# Patient Record
Sex: Male | Born: 1960 | Race: White | Hispanic: No | Smoking: Former smoker
Health system: Southern US, Community
[De-identification: ages and names within clinical notes are randomized; demographics above are authoritative.]

## PROBLEM LIST (undated history)

## (undated) DIAGNOSIS — I10 Essential (primary) hypertension: Secondary | ICD-10-CM

## (undated) DIAGNOSIS — K219 Gastro-esophageal reflux disease without esophagitis: Secondary | ICD-10-CM

## (undated) HISTORY — DX: Essential (primary) hypertension: I10

## (undated) HISTORY — PX: SHOULDER ARTHROSCOPY: SHX128

## (undated) HISTORY — PX: ARTHROSCOPY KNEE W/ DRILLING: SUR92

## (undated) HISTORY — DX: Gastro-esophageal reflux disease without esophagitis: K21.9

---

## 2000-05-21 ENCOUNTER — Emergency Department (HOSPITAL_COMMUNITY): Admission: EM | Admit: 2000-05-21 | Discharge: 2000-05-21 | Payer: Self-pay | Admitting: Emergency Medicine

## 2005-04-08 ENCOUNTER — Other Ambulatory Visit: Admission: RE | Admit: 2005-04-08 | Discharge: 2005-04-08 | Payer: Self-pay | Admitting: Urology

## 2010-08-10 ENCOUNTER — Emergency Department (HOSPITAL_COMMUNITY): Admission: EM | Admit: 2010-08-10 | Discharge: 2010-08-10 | Payer: Self-pay | Admitting: Emergency Medicine

## 2010-10-04 ENCOUNTER — Encounter: Payer: Self-pay | Admitting: Rheumatology

## 2010-11-24 LAB — CBC
MCV: 94.7 fL (ref 78.0–100.0)
Platelets: 237 10*3/uL (ref 150–400)
RBC: 4.5 MIL/uL (ref 4.22–5.81)
WBC: 6.6 10*3/uL (ref 4.0–10.5)

## 2011-09-27 ENCOUNTER — Ambulatory Visit: Payer: Self-pay

## 2012-11-06 ENCOUNTER — Other Ambulatory Visit: Payer: Self-pay | Admitting: Internal Medicine

## 2013-02-15 ENCOUNTER — Other Ambulatory Visit (HOSPITAL_COMMUNITY)
Admission: RE | Admit: 2013-02-15 | Discharge: 2013-02-15 | Disposition: A | Discharge status: Home / Self Care | Payer: BC Managed Care – PPO | Source: Ambulatory Visit | Attending: Urology | Admitting: Urology

## 2013-02-15 DIAGNOSIS — N498 Inflammatory disorders of other specified male genital organs: Secondary | ICD-10-CM | POA: Insufficient documentation

## 2013-02-15 DIAGNOSIS — L723 Sebaceous cyst: Secondary | ICD-10-CM | POA: Insufficient documentation

## 2014-12-04 ENCOUNTER — Other Ambulatory Visit: Payer: Self-pay | Admitting: Orthopedic Surgery

## 2014-12-04 DIAGNOSIS — M25512 Pain in left shoulder: Secondary | ICD-10-CM

## 2014-12-18 ENCOUNTER — Other Ambulatory Visit: Payer: Self-pay

## 2015-11-06 ENCOUNTER — Ambulatory Visit
Admission: RE | Admit: 2015-11-06 | Discharge: 2015-11-06 | Disposition: A | Discharge status: Home / Self Care | Payer: BLUE CROSS/BLUE SHIELD | Source: Ambulatory Visit | Attending: Internal Medicine | Admitting: Internal Medicine

## 2015-11-06 ENCOUNTER — Other Ambulatory Visit: Payer: Self-pay | Admitting: Internal Medicine

## 2015-11-06 DIAGNOSIS — M549 Dorsalgia, unspecified: Secondary | ICD-10-CM

## 2015-11-14 ENCOUNTER — Encounter: Payer: Self-pay | Admitting: Family Medicine

## 2015-11-14 DIAGNOSIS — I10 Essential (primary) hypertension: Secondary | ICD-10-CM | POA: Insufficient documentation

## 2015-12-03 ENCOUNTER — Encounter: Payer: Self-pay | Admitting: Family Medicine

## 2015-12-03 ENCOUNTER — Ambulatory Visit (INDEPENDENT_AMBULATORY_CARE_PROVIDER_SITE_OTHER): Payer: BLUE CROSS/BLUE SHIELD | Admitting: Family Medicine

## 2015-12-03 VITALS — BP 140/88 | HR 72 | Temp 98.5°F | Resp 16 | Ht 71.0 in | Wt 212.0 lb

## 2015-12-03 DIAGNOSIS — M159 Polyosteoarthritis, unspecified: Secondary | ICD-10-CM

## 2015-12-03 DIAGNOSIS — M1990 Unspecified osteoarthritis, unspecified site: Secondary | ICD-10-CM | POA: Insufficient documentation

## 2015-12-03 DIAGNOSIS — N200 Calculus of kidney: Secondary | ICD-10-CM | POA: Diagnosis not present

## 2015-12-03 DIAGNOSIS — K219 Gastro-esophageal reflux disease without esophagitis: Secondary | ICD-10-CM | POA: Insufficient documentation

## 2015-12-03 DIAGNOSIS — M150 Primary generalized (osteo)arthritis: Secondary | ICD-10-CM | POA: Diagnosis not present

## 2015-12-03 DIAGNOSIS — I10 Essential (primary) hypertension: Secondary | ICD-10-CM | POA: Diagnosis not present

## 2015-12-03 DIAGNOSIS — N529 Male erectile dysfunction, unspecified: Secondary | ICD-10-CM | POA: Diagnosis not present

## 2015-12-03 DIAGNOSIS — M8949 Other hypertrophic osteoarthropathy, multiple sites: Secondary | ICD-10-CM

## 2015-12-03 NOTE — Progress Notes (Signed)
Patient ID: Michael FreestoneJoe Wheeler, male   DOB: August 15, 1961, 55 y.o.   MRN: [id number]    Subjective:    Patient ID: Michael FreestoneJoe Pellot, male    DOB: August 15, 1961, 55 y.o.   MRN: [id number]  [facility] here to establish care. Of note he was last been seen by Dr. Nehemiah SettlePolite. At the end of the visit he states that he had a scheduled physical scheduled with him for next month I recommended that he chews one provider to be his primary care physician so he can have the best care as possible and to decrease medical errors.  He is history of hypertension he is intentionally lost about 30 pounds trying to get off of his blood pressure medication. He has not taken his propanolol on a regular basis but does take losartan HCTZ. He does not have a history of hyperlipidemia no coronary artery disease.  For the past few weeks he's been suffering with a pulled muscle in his left upper abdomen and flank region. He had x-ray CT scan which showed kidney stones but this was not an active problem. He has had stones in the past as well. He was initially given muscle relaxers and then he was prescribed hydrocodone which she is still taking twice a day. 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 He also had a no prescription for prednisone which he has been tapering he started at 60 mg he states this has helped. He's also tried heat and ice. He does lift weights and exercise but he does not recall any injury to the area.  His history of acid reflux but he does not take the Prilosec on a regular basis he notices specific foods that trigger this. He has history of osteoarthritis in his knees and his back is had arthroscopic done in the past he takes Celebrex typically.  He is in a relationship he has 2 adult children  He is overdue for tetanus booster he is overdue for colonoscopy  He follows with urology to get his  prescription for Viagra he states that he buys it from [place]  Review Of Systems:  GEN- denies fatigue, fever, weight loss,weakness, recent illness HEENT- denies eye drainage, change in vision, nasal discharge, CVS- denies chest pain, palpitations RESP- denies SOB, cough, wheeze ABD- denies N/V, change in stools, abd pain GU- denies dysuria, hematuria, dribbling, incontinence MSK-+joint pain, muscle aches, injury Neuro- denies headache, dizziness, syncope, seizure activity       Objective:    BP 140/88 mmHg  Pulse 72  Temp(Src) 98.5 F (36.9 C) (Oral)  Resp 16  Ht 5\' 11"  (1.803 m)  Wt 212 lb (96.163 kg)  BMI 29.58 kg/m2 GEN- NAD, alert and oriented x3 HEENT- PERRL, EOMI, non injected sclera, pink conjunctiva, MMM, oropharynx clear Neck- Supple, no thyromegaly CVS- RRR, no murmur RESP-CTAB ABD-NABS,soft,mild TTP LUQ beneath rib cage, no hernia noted, no spasm  MSK- spine NT, good ROM Psych- normal affect and mood EXT- No edema Pulses- Radial, DP- 2+        Assessment & Plan:      Problem List Items Addressed This Visit    OA (osteoarthritis)    He has Celebrex that he takes on a regular basis. Of note he is also currently self treating for this pulled muscle in his abdomen. He is tapering down her dose of prednisone that he  has at home. I recommend that he not take this along with Celebrex. He is also on hydrocodone given by his previous primary care provider. I will obtain records and imaging with regards to this recent injury and his past      Kidney stones   Hypertension - Primary    His blood pressure today is little elevated. Reiterated that if his blood pressure has been running up that he should take the Inderal a regular basis. For now he states that he does want to continue with the losartan HCTZ as it is typically well controlled. He will also make a decision he will follow-up with my office for continued care I recommended that he not have 2 different  primary care providers. If he does he needs a physical scheduled for fasting labs.      Relevant Medications   propranolol ER (INDERAL LA) 80 MG 24 hr capsule   GERD (gastroesophageal reflux disease)    Prilosec as needed      Erectile dysfunction    He will continue Viagra prescribed by his urologist         Note: This dictation was prepared with Dragon dictation along with smaller phrase technology. Any transcriptional errors that result from this process are unintentional.

## 2015-12-03 NOTE — Patient Instructions (Addendum)
Continue current medications Release of records- Dr. Nehemiah SettlePolite  F/U physical end of April- Fasting

## 2015-12-03 NOTE — Assessment & Plan Note (Signed)
He will continue Viagra prescribed by his urologist

## 2015-12-03 NOTE — Assessment & Plan Note (Signed)
He has Celebrex that he takes on a regular basis. Of note he is also currently self treating for this pulled muscle in his abdomen. He is tapering down her dose of prednisone that he has at home. I recommend that he not take this along with Celebrex. He is also on hydrocodone given by his previous primary care provider. I will obtain records and imaging with regards to this recent injury and his past

## 2015-12-03 NOTE — Assessment & Plan Note (Signed)
His blood pressure today is little elevated. Reiterated that if his blood pressure has been running up that he should take the Inderal a regular basis. For now he states that he does want to continue with the losartan HCTZ as it is typically well controlled. He will also make a decision he will follow-up with my office for continued care I recommended that he not have 2 different primary care providers. If he does he needs a physical scheduled for fasting labs.

## 2015-12-03 NOTE — Assessment & Plan Note (Signed)
Prilosec as needed.

## 2016-02-02 ENCOUNTER — Encounter: Payer: BLUE CROSS/BLUE SHIELD | Admitting: Family Medicine

## 2016-02-04 ENCOUNTER — Encounter: Payer: Self-pay | Admitting: Family Medicine

## 2016-02-06 ENCOUNTER — Ambulatory Visit: Payer: BLUE CROSS/BLUE SHIELD | Admitting: Family Medicine

## 2016-02-20 ENCOUNTER — Encounter: Payer: Self-pay | Admitting: Family Medicine

## 2016-02-20 ENCOUNTER — Ambulatory Visit (INDEPENDENT_AMBULATORY_CARE_PROVIDER_SITE_OTHER): Payer: BLUE CROSS/BLUE SHIELD | Admitting: Family Medicine

## 2016-02-20 VITALS — BP 130/64 | HR 62 | Temp 98.2°F | Resp 14 | Ht 71.0 in | Wt 208.0 lb

## 2016-02-20 DIAGNOSIS — I10 Essential (primary) hypertension: Secondary | ICD-10-CM | POA: Diagnosis not present

## 2016-02-20 DIAGNOSIS — Z1159 Encounter for screening for other viral diseases: Secondary | ICD-10-CM

## 2016-02-20 DIAGNOSIS — Z114 Encounter for screening for human immunodeficiency virus [HIV]: Secondary | ICD-10-CM

## 2016-02-20 DIAGNOSIS — N529 Male erectile dysfunction, unspecified: Secondary | ICD-10-CM

## 2016-02-20 DIAGNOSIS — Z0000 Encounter for general adult medical examination without abnormal findings: Secondary | ICD-10-CM | POA: Diagnosis not present

## 2016-02-20 LAB — CBC WITH DIFFERENTIAL/PLATELET
BASOS PCT: 1 %
Basophils Absolute: 50 cells/uL (ref 0–200)
EOS ABS: 50 {cells}/uL (ref 15–500)
Eosinophils Relative: 1 %
HCT: 43 % (ref 38.5–50.0)
HEMOGLOBIN: 14.3 g/dL (ref 13.0–17.0)
LYMPHS ABS: 1650 {cells}/uL (ref 850–3900)
Lymphocytes Relative: 33 %
MCH: 32.4 pg (ref 27.0–33.0)
MCHC: 33.3 g/dL (ref 32.0–36.0)
MCV: 97.5 fL (ref 80.0–100.0)
MONO ABS: 550 {cells}/uL (ref 200–950)
MONOS PCT: 11 %
MPV: 9.5 fL (ref 7.5–12.5)
NEUTROS ABS: 2700 {cells}/uL (ref 1500–7800)
Neutrophils Relative %: 54 %
PLATELETS: 278 10*3/uL (ref 140–400)
RBC: 4.41 MIL/uL (ref 4.20–5.80)
RDW: 12.9 % (ref 11.0–15.0)
WBC: 5 10*3/uL (ref 3.8–10.8)

## 2016-02-20 LAB — COMPREHENSIVE METABOLIC PANEL
ALK PHOS: 39 U/L — AB (ref 40–115)
ALT: 16 U/L (ref 9–46)
AST: 25 U/L (ref 10–35)
Albumin: 4.2 g/dL (ref 3.6–5.1)
BUN: 18 mg/dL (ref 7–25)
CO2: 24 mmol/L (ref 20–31)
CREATININE: 0.98 mg/dL (ref 0.70–1.33)
Calcium: 9.3 mg/dL (ref 8.6–10.3)
Chloride: 104 mmol/L (ref 98–110)
Glucose, Bld: 103 mg/dL — ABNORMAL HIGH (ref 70–99)
POTASSIUM: 4.2 mmol/L (ref 3.5–5.3)
Sodium: 138 mmol/L (ref 135–146)
TOTAL PROTEIN: 6.9 g/dL (ref 6.1–8.1)
Total Bilirubin: 0.5 mg/dL (ref 0.2–1.2)

## 2016-02-20 LAB — LIPID PANEL
CHOL/HDL RATIO: 2 ratio (ref <=5.0)
CHOLESTEROL: 134 mg/dL (ref 125–200)
HDL: 68 mg/dL (ref >=40)
LDL Cholesterol: 51 mg/dL (ref <130)
Triglycerides: 74 mg/dL (ref <150)
VLDL: 15 mg/dL (ref <30)

## 2016-02-20 MED ORDER — SILDENAFIL CITRATE 100 MG PO TABS: 100.0000 mg | ORAL_TABLET | ORAL | Status: DC | PRN

## 2016-02-20 NOTE — Patient Instructions (Addendum)
I recommend eye visit once a year I recommend dental visit every 6 months Goal is to  Exercise 30 minutes 5 days a week We will send a letter with lab results  Release of records- [facility]  Release of records- [facility] for colonoscopy  F/U as needed

## 2016-02-20 NOTE — Assessment & Plan Note (Signed)
I will refill his viagra when needed

## 2016-02-20 NOTE — Progress Notes (Signed)
Patient ID: Michael Wheeler, male   DOB: May 10, 1961, 55 y.o.   MRN: [id number]    Subjective:    Patient ID: Michael FreestoneJoe Wheeler, male    DOB: May 10, 1961, 55 y.o.   MRN: [id number]  Patient presents for CPE   Hypertension- taking meds as prescribed's. he is not having any concerns today.  Colonoscopy- before meals [facility] Dr[facility] Laural BenesJohnson about 5 years ago he had 1 polyp he states he does not want a repeat his colonoscopy until 10 years a recommendation was that he come back in 5 years.  Follows with urology- for erectile dysfunction on Viagra, he states that he had testosterone and PSA testing done and was told it was normal. He does request that I refill his Viagra which she actually gets from a mail order in  [place]   Immunizations- TDAP done with recent dog bite     Agrees to HIV testing/Hep C testing   Review Of Systems:  GEN- denies fatigue, fever, weight loss,weakness, recent illness HEENT- denies eye drainage, change in vision, nasal discharge, CVS- denies chest pain, palpitations RESP- denies SOB, cough, wheeze ABD- denies N/V, change in stools, abd pain GU- denies dysuria, hematuria, dribbling, incontinence MSK- + joint pain, muscle aches, injury Neuro- denies headache, dizziness, syncope, seizure activity       Objective:    BP 130/64 mmHg  Pulse 62  Temp(Src) 98.2 F (36.8 C) (Oral)  Resp 14  Ht 5\' 11"  (1.803 m)  Wt 208 lb (94.348 kg)  BMI 29.02 kg/m2 GEN- NAD, alert and oriented x3 HEENT- PERRL, EOMI, non injected sclera, pink conjunctiva, MMM, oropharynx clear, nares clear rhinorrhea  Neck- Supple, no thyromegaly CVS- RRR, no murmur RESP-CTAB ABD-NABS,soft,NT,ND GU- deferred  EXT- No edema Pulses- Radial, DP- 2+        Assessment & Plan:      Problem List Items Addressed This Visit    Hypertension    Well controlled, no change       Relevant Medications   sildenafil (VIAGRA) 100 MG tablet   Erectile dysfunction    I will refill  his viagra when needed       Other Visit Diagnoses    Routine general medical examination at a health care facility    -  Primary    CPE done, obtain records from Urology and GI to update records, fasting labs today, Encourage EXCERISE, healthy eating     Relevant Orders    CBC with Differential/Platelet    Comprehensive metabolic panel    Lipid panel    Hepatitis C Antibody    Need for hepatitis C screening test        Encounter for screening for HIV        Relevant Orders    HIV antibody       Note: This dictation was prepared with Dragon dictation along with smaller phrase technology. Any transcriptional errors that result from this process are unintentional.

## 2016-02-20 NOTE — Assessment & Plan Note (Signed)
Well-controlled, no change 

## 2016-02-21 LAB — HIV ANTIBODY (ROUTINE TESTING W REFLEX): HIV: NONREACTIVE

## 2016-02-21 LAB — HEPATITIS C ANTIBODY: HCV Ab: NEGATIVE

## 2016-04-22 ENCOUNTER — Telehealth: Payer: Self-pay | Admitting: *Deleted

## 2016-04-22 ENCOUNTER — Other Ambulatory Visit: Payer: Self-pay | Admitting: Family Medicine

## 2016-04-22 NOTE — Telephone Encounter (Signed)
Patient called and needs a refill of his Viagra . Patient pharmacy is [facility] on [address]. Please advise. Thanks.

## 2016-04-22 NOTE — Telephone Encounter (Signed)
Please see patient message.

## 2016-04-22 NOTE — Telephone Encounter (Signed)
Ok to refill??  Last office visit /refill 02/20/2016.

## 2016-04-23 NOTE — Telephone Encounter (Signed)
Please verify that patient is actually taking this often. This would be 1 viagra tablet every day, since it has only been 2 months?

## 2016-04-26 ENCOUNTER — Other Ambulatory Visit: Payer: Self-pay | Admitting: *Deleted

## 2016-04-26 MED ORDER — SILDENAFIL CITRATE 20 MG PO TABS: ORAL_TABLET | ORAL | 0 refills | Status: AC

## 2016-04-26 MED ORDER — SILDENAFIL CITRATE 100 MG PO TABS: 100.0000 mg | ORAL_TABLET | ORAL | 0 refills | Status: DC | PRN

## 2016-04-26 NOTE — Telephone Encounter (Signed)
Received return call from patient.   Reports that he only buys partial fill of medication (7 pills/ month).   Prescription sent to pharmacy.

## 2016-04-26 NOTE — Telephone Encounter (Signed)
Call placed to patient. LMTRC.  

## 2016-06-15 ENCOUNTER — Telehealth: Payer: Self-pay | Admitting: Family Medicine

## 2016-06-15 DIAGNOSIS — M5136 Other intervertebral disc degeneration, lumbar region: Secondary | ICD-10-CM

## 2016-06-15 DIAGNOSIS — M5134 Other intervertebral disc degeneration, thoracic region: Secondary | ICD-10-CM

## 2016-06-15 NOTE — Telephone Encounter (Signed)
Patient said that when he was in for his cpe with dr Jeanice Limdurham, he mentioned his back pain with her, he would like a referral to [facility] for this if possible  [phone] (H)

## 2016-06-15 NOTE — Telephone Encounter (Signed)
Ok to place referral.

## 2016-06-15 NOTE — Telephone Encounter (Signed)
He has DDD lumbar and thoracic spine No MRI on board, advise sometimes they will not schedule without further imaging But go ahead and place referral first.

## 2016-06-16 NOTE — Telephone Encounter (Signed)
Call placed to patient and patient made aware.   Referral orders placed.  

## 2016-11-04 ENCOUNTER — Telehealth: Payer: Self-pay | Admitting: *Deleted

## 2016-11-04 DIAGNOSIS — M5136 Other intervertebral disc degeneration, lumbar region: Secondary | ICD-10-CM

## 2016-11-04 DIAGNOSIS — M5134 Other intervertebral disc degeneration, thoracic region: Secondary | ICD-10-CM

## 2016-11-04 NOTE — Telephone Encounter (Signed)
Received call from patient.   Reports that he would like to schedule with [facility] at this time for his back pain.   Referral was placed in Oct 2017.

## 2016-11-05 NOTE — Telephone Encounter (Signed)
Pt now ready to be seen by Neuro-Surg.  New order placed.

## 2016-11-19 ENCOUNTER — Other Ambulatory Visit: Payer: Self-pay | Admitting: Family Medicine

## 2017-01-26 ENCOUNTER — Telehealth: Payer: Self-pay

## 2017-01-27 NOTE — Telephone Encounter (Signed)
error 

## 2017-03-01 ENCOUNTER — Ambulatory Visit (INDEPENDENT_AMBULATORY_CARE_PROVIDER_SITE_OTHER): Payer: BLUE CROSS/BLUE SHIELD | Admitting: Family Medicine

## 2017-03-01 ENCOUNTER — Encounter: Payer: Self-pay | Admitting: Family Medicine

## 2017-03-01 VITALS — BP 132/90 | HR 78 | Temp 98.1°F | Resp 14 | Ht 70.0 in | Wt 239.0 lb

## 2017-03-01 DIAGNOSIS — Z0000 Encounter for general adult medical examination without abnormal findings: Secondary | ICD-10-CM | POA: Diagnosis not present

## 2017-03-01 DIAGNOSIS — I10 Essential (primary) hypertension: Secondary | ICD-10-CM

## 2017-03-01 NOTE — Progress Notes (Signed)
Subjective:    Patient ID: Michael Wheeler, male    DOB: 20-Jul-1961, 56 y.o.   MRN: [id number]  HPI  Patient is a very pleasant 56 year old white male here today for complete physical exam. Last colonoscopy was greater than 5 years ago. He states that there were polyps. He was told to have another colonoscopy in 5 years but he declines this. He would be willing to consent to fecal occult blood cards 3. He is due for digital rectal exam as well as a PSA. He recently had a rotator cuff repair on his right shoulder. He is currently undergoing physical therapy for that. His blood pressure today is borderline elevated diastolic. He denies any chest pain shortness of breath dyspnea on exertion or orthopnea. Past Medical History:  Diagnosis Date  . GERD (gastroesophageal reflux disease)   . Hypertension    Past Surgical History:  Procedure Laterality Date  . ARTHROSCOPY KNEE W/ DRILLING Bilateral   . SHOULDER ARTHROSCOPY Left    Current Outpatient Prescriptions on File Prior to Visit  Medication Sig Dispense Refill  . celecoxib (CELEBREX) 200 MG capsule TAKE ONE CAPSULE BY MOUTH EVERY DAY 90 capsule 0  . LOSARTAN POTASSIUM-HCTZ PO Take by mouth. 50/ 12.5mg  PO QD    . omeprazole (PRILOSEC) 20 MG capsule Take 20 mg by mouth daily. PRN    . propranolol ER (INDERAL LA) 80 MG 24 hr capsule     . sildenafil (REVATIO) 20 MG tablet Use up to 5 tabs PO QD PRN for ED. 100 tablet 0   No current facility-administered medications on file prior to visit.    No Known Allergies Social History   Social History  . Marital status: Divorced    Spouse name: N/A  . Number of children: N/A  . Years of education: N/A   Occupational History  . Not on file.   Social History Main Topics  . Smoking status: Former Smoker    Types: Cigarettes    Quit date: 11/14/2003  . Smokeless tobacco: Never Used  . Alcohol use 1.2 oz/week    1 Glasses of wine, 1 Shots of liquor per week  . Drug use: No  . Sexual activity:  Yes   Other Topics Concern  . Not on file   Social History Narrative  . No narrative on file   Family History  Problem Relation Age of Onset  . Arthritis Mother   . Hypertension Mother   . Diabetes Father   . Heart disease Father   . Stroke Father   . Cancer Brother        lung  . Early death Brother      Review of Systems  All other systems reviewed and are negative.      Objective:   Physical Exam  Constitutional: He is oriented to person, place, and time. He appears well-developed and well-nourished. No distress.  HENT:  Head: Normocephalic and atraumatic.  Right Ear: External ear normal.  Left Ear: External ear normal.  Nose: Nose normal.  Mouth/Throat: Oropharynx is clear and moist. No oropharyngeal exudate.  Eyes: Conjunctivae and EOM are normal. Pupils are equal, round, and reactive to light. Right eye exhibits no discharge. Left eye exhibits no discharge. No scleral icterus.  Neck: Normal range of motion. Neck supple. No JVD present. No tracheal deviation present. No thyromegaly present.  Cardiovascular: Normal rate, regular rhythm, normal heart sounds and intact distal pulses.  Exam reveals no gallop and no friction rub.  No murmur heard. Pulmonary/Chest: Effort normal and breath sounds normal. No stridor. No respiratory distress. He has no wheezes. He has no rales. He exhibits no tenderness.  Abdominal: Soft. Bowel sounds are normal. He exhibits no distension and no mass. There is no tenderness. There is no rebound and no guarding.  Genitourinary: Rectum normal and prostate normal.  Musculoskeletal: Normal range of motion. He exhibits no edema, tenderness or deformity.  Lymphadenopathy:    He has no cervical adenopathy.  Neurological: He is alert and oriented to person, place, and time. He has normal reflexes. He displays normal reflexes. No cranial nerve deficit. He exhibits normal muscle tone. Coordination normal.  Skin: Skin is warm. No rash noted. He is  not diaphoretic. No erythema. No pallor.  Psychiatric: He has a normal mood and affect. His behavior is normal. Judgment and thought content normal.  Vitals reviewed.         Assessment & Plan:  Routine general medical examination at a health care facility  Essential hypertension  Physical exam today is significant only for the pain in his right shoulder status post rotator cuff repair and his mildly elevated diastolic blood pressure. I've asked the patient to monitor his blood pressure closely at home and if his blood pressure remains consistently greater than 140/90, we may need to increase his antihypertensive medication. Patient declines a colonoscopy. He will consent to fecal occult blood cards 3. I would like the patient to return fasting for a CBC, CMP, fasting lipid panel, and a PSA. I will also give the patient fecal occult blood cards 3. The remainder of his preventative care is up-to-date.

## 2017-03-02 ENCOUNTER — Other Ambulatory Visit: Payer: BLUE CROSS/BLUE SHIELD

## 2017-03-02 LAB — CBC WITH DIFFERENTIAL/PLATELET
BASOS ABS: 61 {cells}/uL (ref 0–200)
Basophils Relative: 1 %
Eosinophils Absolute: 183 cells/uL (ref 15–500)
Eosinophils Relative: 3 %
HEMATOCRIT: 42.2 % (ref 38.5–50.0)
Hemoglobin: 13.9 g/dL (ref 13.0–17.0)
LYMPHS ABS: 2013 {cells}/uL (ref 850–3900)
Lymphocytes Relative: 33 %
MCH: 31.4 pg (ref 27.0–33.0)
MCHC: 32.9 g/dL (ref 32.0–36.0)
MCV: 95.5 fL (ref 80.0–100.0)
MONO ABS: 366 {cells}/uL (ref 200–950)
MPV: 9.5 fL (ref 7.5–12.5)
Monocytes Relative: 6 %
NEUTROS PCT: 57 %
Neutro Abs: 3477 cells/uL (ref 1500–7800)
Platelets: 284 10*3/uL (ref 140–400)
RBC: 4.42 MIL/uL (ref 4.20–5.80)
RDW: 13.3 % (ref 11.0–15.0)
WBC: 6.1 10*3/uL (ref 3.8–10.8)

## 2017-03-02 LAB — COMPLETE METABOLIC PANEL WITH GFR
ALBUMIN: 4.3 g/dL (ref 3.6–5.1)
ALK PHOS: 55 U/L (ref 40–115)
ALT: 20 U/L (ref 9–46)
AST: 19 U/L (ref 10–35)
BILIRUBIN TOTAL: 0.4 mg/dL (ref 0.2–1.2)
BUN: 19 mg/dL (ref 7–25)
CALCIUM: 9.4 mg/dL (ref 8.6–10.3)
CO2: 23 mmol/L (ref 20–31)
Chloride: 104 mmol/L (ref 98–110)
Creat: 1.11 mg/dL (ref 0.70–1.33)
GFR, Est African American: 86 mL/min (ref >=60)
GFR, Est Non African American: 74 mL/min (ref >=60)
Glucose, Bld: 95 mg/dL (ref 70–99)
POTASSIUM: 4.5 mmol/L (ref 3.5–5.3)
Sodium: 139 mmol/L (ref 135–146)
TOTAL PROTEIN: 7 g/dL (ref 6.1–8.1)

## 2017-03-02 LAB — LIPID PANEL
CHOLESTEROL: 153 mg/dL (ref <200)
HDL: 48 mg/dL (ref >40)
LDL Cholesterol: 84 mg/dL (ref <100)
Total CHOL/HDL Ratio: 3.2 Ratio (ref <5.0)
Triglycerides: 104 mg/dL (ref <150)
VLDL: 21 mg/dL (ref <30)

## 2017-03-03 LAB — PSA: PSA: 0.4 ng/mL (ref <=4.0)

## 2017-03-08 ENCOUNTER — Encounter: Payer: BLUE CROSS/BLUE SHIELD | Admitting: Family Medicine

## 2017-03-13 ENCOUNTER — Other Ambulatory Visit: Payer: Self-pay | Admitting: Family Medicine

## 2017-03-14 NOTE — Telephone Encounter (Signed)
Medication filled x1 with no refills.   Requires office visit before any further refills can be given.   Letter sent.  

## 2017-07-08 ENCOUNTER — Other Ambulatory Visit: Payer: Self-pay | Admitting: Family Medicine

## 2017-10-13 ENCOUNTER — Other Ambulatory Visit: Payer: Self-pay | Admitting: *Deleted

## 2017-10-13 MED ORDER — CELECOXIB 200 MG PO CAPS: ORAL_CAPSULE | ORAL | 3 refills | Status: DC

## 2018-03-06 ENCOUNTER — Encounter: Payer: BLUE CROSS/BLUE SHIELD | Admitting: Family Medicine

## 2018-03-08 ENCOUNTER — Encounter: Payer: BLUE CROSS/BLUE SHIELD | Admitting: Family Medicine

## 2018-03-13 ENCOUNTER — Encounter: Payer: Self-pay | Admitting: Family Medicine

## 2018-10-22 ENCOUNTER — Other Ambulatory Visit: Payer: Self-pay | Admitting: Family Medicine

## 2020-06-17 ENCOUNTER — Ambulatory Visit
Admission: RE | Admit: 2020-06-17 | Discharge: 2020-06-17 | Disposition: A | Discharge status: Home / Self Care | Payer: BC Managed Care – PPO | Source: Ambulatory Visit | Attending: Internal Medicine | Admitting: Internal Medicine

## 2020-06-17 ENCOUNTER — Other Ambulatory Visit: Payer: Self-pay | Admitting: Internal Medicine

## 2020-06-17 DIAGNOSIS — R0789 Other chest pain: Secondary | ICD-10-CM

## 2021-02-10 IMAGING — CR DG CHEST 2V
2 series · 2 of 2 positions shown · non-contrast
Comparison: Chest radiographs 11/06/2012. CT Abdomen and Pelvis
10/09/2015.

CLINICAL DATA: 59-year-old male with right chest wall pain for 1
week.

EXAM:
CHEST - 2 VIEW

[w chest pa]
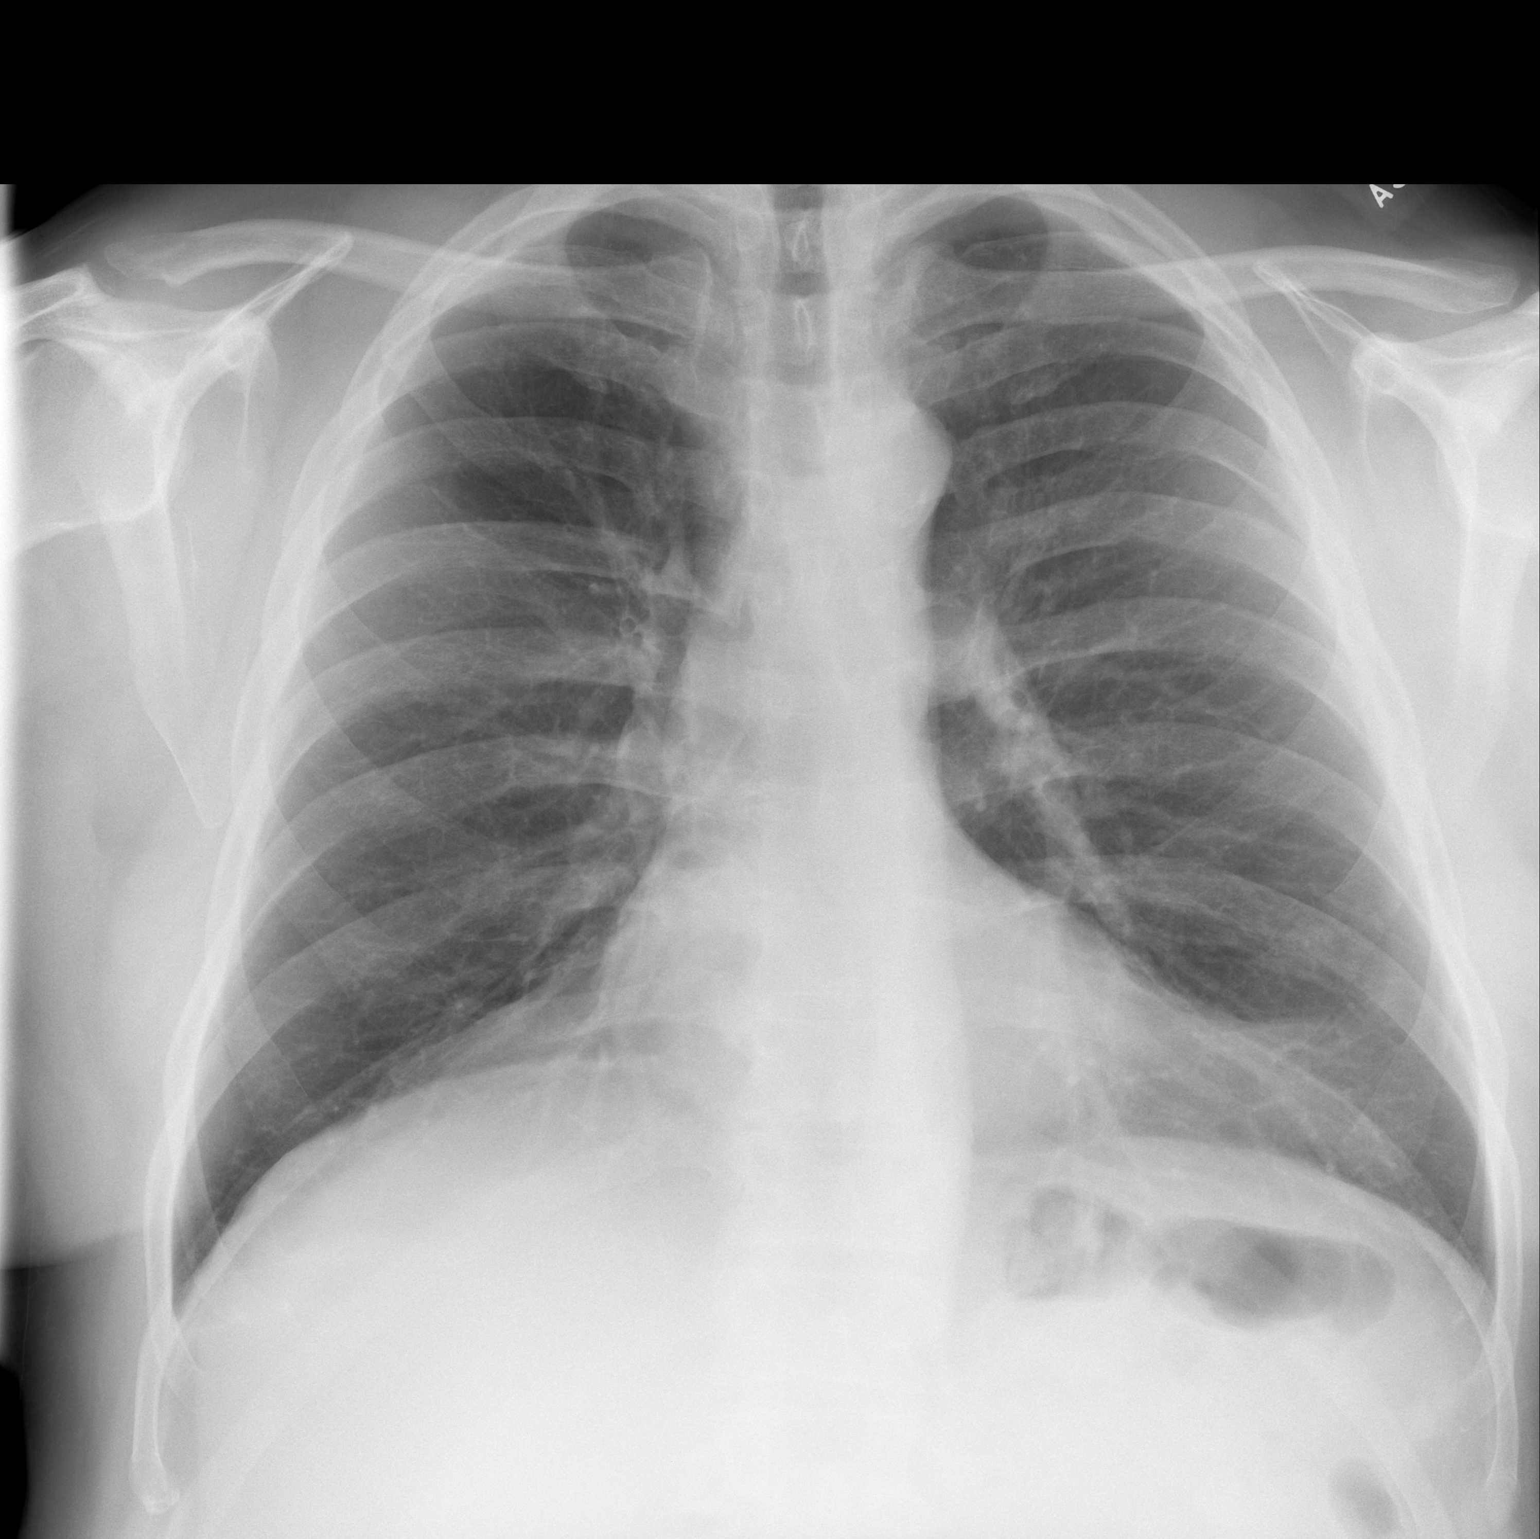

[w chest lat]
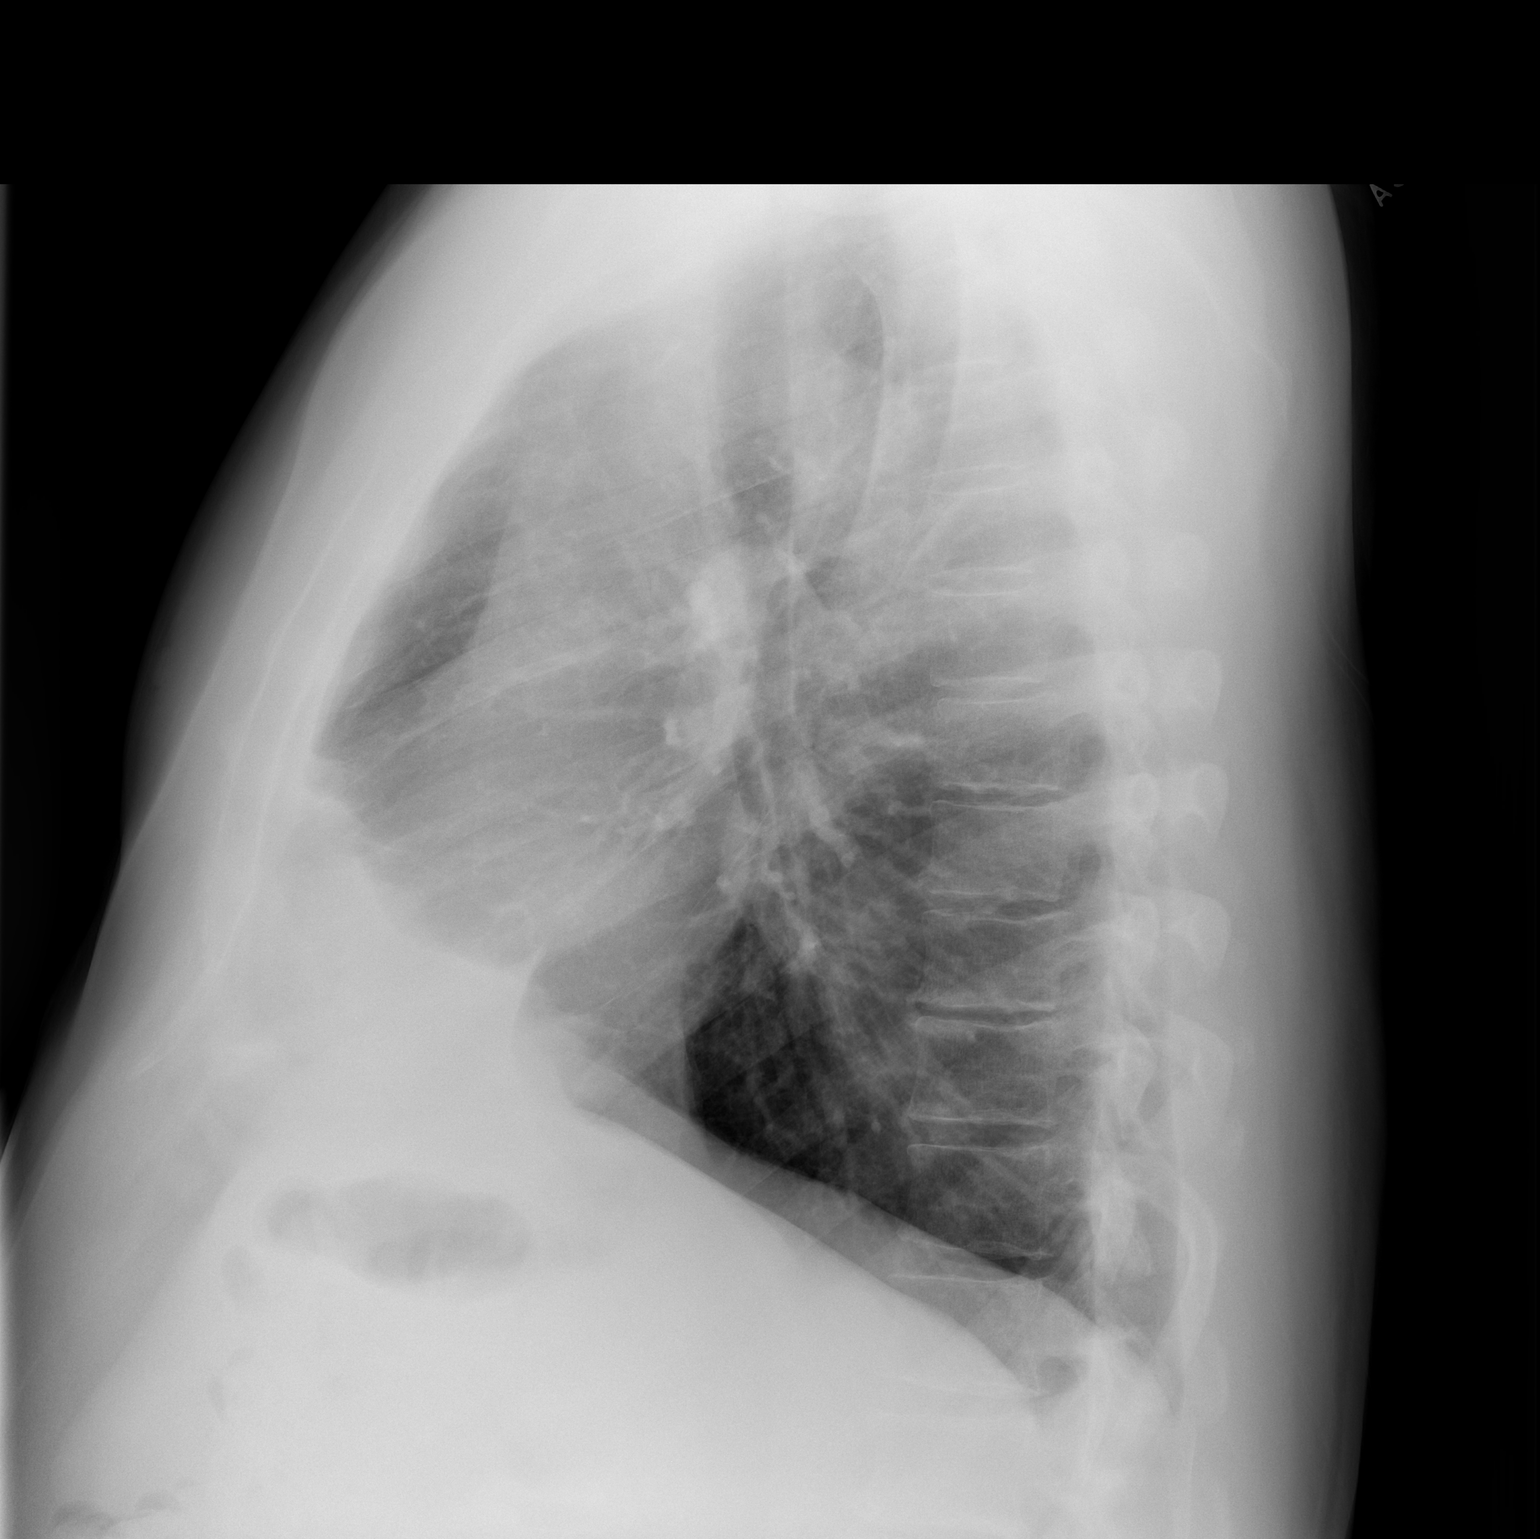

[2 of 2 positions shown; findings below may reference images not displayed]

FINDINGS: Larger lung volumes since 3854, remain within normal limits.
Mediastinal contours are stable and within normal limits. Visualized
tracheal air column is within normal limits. No pneumothorax,
pulmonary edema, pleural effusion or confluent pulmonary opacity.

Bone mineralization is within normal limits. Indistinct appearance
of the anterior right 3rd rib is unchanged since 3854. No acute
osseous abnormality identified. Negative visible bowel gas pattern.
IMPRESSION: 1.  No acute cardiopulmonary abnormality.
2. No radiographic explanation for acute right chest wall pain.

## 2021-04-05 ENCOUNTER — Encounter: Payer: Self-pay | Admitting: Emergency Medicine

## 2021-04-05 ENCOUNTER — Ambulatory Visit
Admission: EM | Admit: 2021-04-05 | Discharge: 2021-04-05 | Disposition: A | Discharge status: Home / Self Care | Payer: BC Managed Care – PPO | Attending: Physician Assistant | Admitting: Physician Assistant

## 2021-04-05 ENCOUNTER — Other Ambulatory Visit: Payer: Self-pay

## 2021-04-05 DIAGNOSIS — H60391 Other infective otitis externa, right ear: Secondary | ICD-10-CM | POA: Diagnosis not present

## 2021-04-05 MED ORDER — NEOMYCIN-POLYMYXIN-HC 3.5-10000-1 OT SOLN: 3.0000 [drp] | Freq: Four times a day (QID) | OTIC | 0 refills | Status: AC

## 2021-04-05 NOTE — ED Triage Notes (Signed)
Right ear pan since Friday.

## 2021-04-05 NOTE — Discharge Instructions (Addendum)
Return if any problems.

## 2021-04-06 NOTE — ED Provider Notes (Signed)
[facility]    CSN: [id number] Arrival date & time: 04/05/21  6213      History   Chief Complaint No chief complaint on file.   HPI Michael Wheeler is a 60 y.o. male.   Pt complains of swelling and pain to his ear canal   The history is provided by the patient. No language interpreter was used.  Otalgia Location:  Right Behind ear:  No abnormality Quality:  Aching Severity:  Moderate Onset quality:  Gradual Timing:  Constant Progression:  Worsening Relieved by:  Nothing Worsened by:  Nothing Ineffective treatments:  None tried Associated symptoms: ear discharge    Past Medical History:  Diagnosis Date   GERD (gastroesophageal reflux disease)    Hypertension     Patient Active Problem List   Diagnosis Date Noted   GERD (gastroesophageal reflux disease) 12/03/2015   Erectile dysfunction 12/03/2015   Kidney stones 12/03/2015   OA (osteoarthritis) 12/03/2015   Hypertension     Past Surgical History:  Procedure Laterality Date   ARTHROSCOPY KNEE W/ DRILLING Bilateral    SHOULDER ARTHROSCOPY Left        Home Medications    Prior to Admission medications   Medication Sig Start Date End Date Taking? Authorizing Provider  neomycin-polymyxin-hydrocortisone (CORTISPORIN) OTIC solution Place 3 drops into the right ear 4 (four) times daily for 10 days. 04/05/21 04/15/21 Yes Cheron Schaumann K, PA-C  celecoxib (CELEBREX) 200 MG capsule TAKE 1 CAPSULE BY MOUTH EVERY DAY 10/23/18   Salley Scarlet, MD  LOSARTAN POTASSIUM-HCTZ PO Take by mouth. 50/ 12.5mg  PO QD    [provider]  omeprazole (PRILOSEC) 20 MG capsule Take 20 mg by mouth daily. PRN    [provider]  propranolol ER (INDERAL LA) 80 MG 24 hr capsule  11/30/15   [provider]  sildenafil (REVATIO) 20 MG tablet Use up to 5 tabs PO QD PRN for ED. 04/26/16   Salley Scarlet, MD    Family History Family History  Problem Relation Age of Onset   Arthritis Mother     Hypertension Mother    Diabetes Father    Heart disease Father    Stroke Father    Cancer Brother        lung   Early death Brother     Social History Social History   Tobacco Use   Smoking status: Former    Types: Cigarettes    Quit date: 11/14/2003    Years since quitting: 17.4   Smokeless tobacco: Never  Substance Use Topics   Alcohol use: Yes    Alcohol/week: 2.0 standard drinks    Types: 1 Glasses of wine, 1 Shots of liquor per week   Drug use: No     Allergies   Patient has no known allergies.   Review of Systems Review of Systems  HENT:  Positive for ear discharge and ear pain.   All other systems reviewed and are negative.   Physical Exam Triage Vital Signs ED Triage Vitals  Enc Vitals Group     BP 04/05/21 0933 134/90     Pulse Rate 04/05/21 0933 63     Resp 04/05/21 0933 18     Temp 04/05/21 0933 97.8 F (36.6 C)     Temp Source 04/05/21 0933 Temporal     SpO2 04/05/21 0933 96 %     Weight --      Height --      Head Circumference --  Peak Flow --      Pain Score 04/05/21 0934 0     Pain Loc --      Pain Edu? --      Excl. in GC? --    No data found.  Updated Vital Signs BP 134/90 (BP Location: Right Arm)   Pulse 63   Temp 97.8 F (36.6 C) (Temporal)   Resp 18   SpO2 96%   Visual Acuity Right Eye Distance:   Left Eye Distance:   Bilateral Distance:    Right Eye Near:   Left Eye Near:    Bilateral Near:     Physical Exam Vitals reviewed.  Constitutional:      Appearance: Normal appearance.  HENT:     Ears:     Comments: Swollen ear canal, I can not see landmarks right ear    Mouth/Throat:     Mouth: Mucous membranes are moist.  Cardiovascular:     Rate and Rhythm: Normal rate.  Neurological:     Mental Status: He is alert.     UC Treatments / Results  Labs (all labs ordered are listed, but only abnormal results are displayed) Labs Reviewed - No data to display  EKG   Radiology No results  found.  Procedures Procedures (including critical care time)  Medications Ordered in UC Medications - No data to display  Initial Impression / Assessment and Plan / UC Course  I have reviewed the triage vital signs and the nursing notes.  Pertinent labs & imaging results that were available during my care of the patient were reviewed by me and considered in my medical decision making (see chart for details).      Final Clinical Impressions(s) / UC Diagnoses   Final diagnoses:  Otitis, externa, infective, right     Discharge Instructions      Return if any problems.   ED Prescriptions     Medication Sig Dispense Auth. Provider   neomycin-polymyxin-hydrocortisone (CORTISPORIN) OTIC solution Place 3 drops into the right ear 4 (four) times daily for 10 days. 10 mL Elson Areas, [place]      PDMP not reviewed this encounter. An After Visit Summary was printed and given to the patient.    Elson Areas, [place] 04/06/21 1331

## 2022-09-27 ENCOUNTER — Ambulatory Visit
Admission: EM | Admit: 2022-09-27 | Discharge: 2022-09-27 | Disposition: A | Discharge status: Home / Self Care | Payer: BC Managed Care – PPO | Attending: Nurse Practitioner | Admitting: Nurse Practitioner

## 2022-09-27 DIAGNOSIS — J0120 Acute ethmoidal sinusitis, unspecified: Secondary | ICD-10-CM

## 2022-09-27 MED ORDER — AMOXICILLIN-POT CLAVULANATE 875-125 MG PO TABS: 1.0000 | ORAL_TABLET | Freq: Two times a day (BID) | ORAL | 0 refills | Status: AC

## 2022-09-27 MED ORDER — BENZONATATE 100 MG PO CAPS: 100.0000 mg | ORAL_CAPSULE | Freq: Three times a day (TID) | ORAL | 0 refills | Status: AC | PRN

## 2022-09-27 NOTE — Discharge Instructions (Addendum)
I believe you have a bacterial sinus infection.  Take Augmentin as prescribed to treat it.    Some things that can make you feel better are: - Increased rest - Increasing fluid with water/sugar free electrolytes - Acetaminophen and ibuprofen as needed for fever/pain - Salt water gargling, chloraseptic spray and throat lozenges - OTC guaifenesin (Mucinex) 600 mg twice daily for congestion - Saline sinus flushes or a neti pot for congestion - Humidifying the air -Tessalon Perles every 8 hours as needed for dry cough

## 2022-09-27 NOTE — ED Provider Notes (Signed)
[facility]    CSN: [id number] Arrival date & time: 09/27/22  1221      History   Chief Complaint Chief Complaint  Patient presents with   Facial Pain   Headache    HPI Michael Wheeler is a 62 y.o. male.   Patient presents today for 10 days of nasal congestion, runny nose and post nasal drainage, sore throat, sinus pressure in cheeks and above eyes, headache, decreased appetite, and fatigue.  No fever, cough, shortness of breath or chest pain, chest tightness or congestion, abdominal pain, nausea/vomiting, diarrhea, or loss of taste/smell.  No known sick contacts. Has been taking Tylenol and Daqyuil/Nyquil for symptoms which help minimally.    Denies antibiotic use in the past 90 days; also denies allergy to antibiotic therapy.  Denies history of COPD or smoking history.  Reports he had asthma as a child, has not needed inhalers as an adult.    He asks me today for erythromycin and then a "Z-Pak".    Past Medical History:  Diagnosis Date   GERD (gastroesophageal reflux disease)    Hypertension     Patient Active Problem List   Diagnosis Date Noted   GERD (gastroesophageal reflux disease) 12/03/2015   Erectile dysfunction 12/03/2015   Kidney stones 12/03/2015   OA (osteoarthritis) 12/03/2015   Hypertension     Past Surgical History:  Procedure Laterality Date   ARTHROSCOPY KNEE W/ DRILLING Bilateral    SHOULDER ARTHROSCOPY Left        Home Medications    Prior to Admission medications   Medication Sig Start Date End Date Taking? Authorizing Provider  amoxicillin-clavulanate (AUGMENTIN) 875-125 MG tablet Take 1 tablet by mouth 2 (two) times daily for 7 days. 09/27/22 10/04/22 Yes Valentino Nose, NP  benzonatate (TESSALON) 100 MG capsule Take 1 capsule (100 mg total) by mouth 3 (three) times daily as needed for cough. Do not take with alcohol or while driving or operating heavy machinery.  May cause drowsiness. 09/27/22  Yes Cathlean Marseilles A, NP   celecoxib (CELEBREX) 200 MG capsule TAKE 1 CAPSULE BY MOUTH EVERY DAY 10/23/18  Yes [place], Velna Hatchet, MD  LOSARTAN POTASSIUM-HCTZ PO Take by mouth. 50/ 12.5mg  PO QD   Yes [provider]  omeprazole (PRILOSEC) 20 MG capsule Take 20 mg by mouth daily. PRN   Yes [provider]  propranolol ER (INDERAL LA) 80 MG 24 hr capsule  11/30/15  Yes [provider]  sildenafil (REVATIO) 20 MG tablet Use up to 5 tabs PO QD PRN for ED. 04/26/16  Yes [place], Velna Hatchet, MD    Family History Family History  Problem Relation Age of Onset   Arthritis Mother    Hypertension Mother    Diabetes Father    Heart disease Father    Stroke Father    Cancer Brother        lung   Early death Brother     Social History Social History   Tobacco Use   Smoking status: Former    Types: Cigarettes    Quit date: 11/14/2003    Years since quitting: 18.8   Smokeless tobacco: Never  Substance Use Topics   Alcohol use: Yes    Alcohol/week: 2.0 standard drinks of alcohol    Types: 1 Glasses of wine, 1 Shots of liquor per week   Drug use: No     Allergies   Patient has no known allergies.   Review of Systems Review of Systems Per  HPI  Physical Exam Triage Vital Signs ED Triage Vitals  Enc Vitals Group     BP 09/27/22 1254 135/89     Pulse Rate 09/27/22 1254 79     Resp 09/27/22 1254 18     Temp 09/27/22 1254 98.8 F (37.1 C)     Temp Source 09/27/22 1254 Oral     SpO2 09/27/22 1254 95 %     Weight --      Height --      Head Circumference --      Peak Flow --      Pain Score 09/27/22 1255 2     Pain Loc --      Pain Edu? --      Excl. in [place]? --    No data found.  Updated Vital Signs BP 135/89 (BP Location: Right Arm)   Pulse 79   Temp 98.8 F (37.1 C) (Oral)   Resp 18   SpO2 95%   Visual Acuity Right Eye Distance:   Left Eye Distance:   Bilateral Distance:    Right Eye Near:   Left Eye Near:    Bilateral Near:     Physical Exam Vitals and nursing  note reviewed.  Constitutional:      General: He is not in acute distress.    Appearance: Normal appearance. He is not ill-appearing or toxic-appearing.  HENT:     Head: Normocephalic and atraumatic.     Right Ear: Tympanic membrane, ear canal and external ear normal.     Left Ear: Tympanic membrane, ear canal and external ear normal.     Nose: Congestion and rhinorrhea present.     Right Sinus: No maxillary sinus tenderness or frontal sinus tenderness.     Left Sinus: No maxillary sinus tenderness or frontal sinus tenderness.     Comments: Ethmoidal sinus tenderness to percussion    Mouth/Throat:     Mouth: Mucous membranes are moist.     Pharynx: Oropharynx is clear. Posterior oropharyngeal erythema present. No oropharyngeal exudate.     Tonsils: No tonsillar exudate. 0 on the right. 0 on the left.  Eyes:     General: No scleral icterus.    Extraocular Movements: Extraocular movements intact.  Cardiovascular:     Rate and Rhythm: Normal rate and regular rhythm.  Pulmonary:     Effort: Pulmonary effort is normal. No respiratory distress.     Breath sounds: Normal breath sounds. No wheezing, rhonchi or rales.  Abdominal:     General: Abdomen is flat. Bowel sounds are normal. There is no distension.     Palpations: Abdomen is soft.     Tenderness: There is no abdominal tenderness.  Musculoskeletal:     Cervical back: Normal range of motion and neck supple.  Lymphadenopathy:     Cervical: Cervical adenopathy present.  Skin:    General: Skin is warm and dry.     Coloration: Skin is not jaundiced or pale.     Findings: No erythema or rash.  Neurological:     Mental Status: He is alert and oriented to person, place, and time.  Psychiatric:        Behavior: Behavior is cooperative.      UC Treatments / Results  Labs (all labs ordered are listed, but only abnormal results are displayed) Labs Reviewed - No data to display  EKG   Radiology No results  found.  Procedures Procedures (including critical care time)  Medications Ordered in UC Medications -  No data to display  Initial Impression / Assessment and Plan / UC Course  I have reviewed the triage vital signs and the nursing notes.  Pertinent labs & imaging results that were available during my care of the patient were reviewed by me and considered in my medical decision making (see chart for details).   Patient is well-appearing, normotensive, afebrile, not tachycardic, not tachypneic, oxygenating well on room air.    Acute non-recurrent ethmoidal sinusitis Treat with Augmentin twice daily for 7 days Discussed that azithromycin is not indicated for sinus infections Supportive care discussed - start cough suppressant as needed for dry cough ER and return precautions discussed   The patient was given the opportunity to ask questions.  All questions answered to their satisfaction.  The patient is in agreement to this plan.   Final Clinical Impressions(s) / UC Diagnoses   Final diagnoses:  Acute non-recurrent ethmoidal sinusitis     Discharge Instructions      I believe you have a bacterial sinus infection.  Take Augmentin as prescribed to treat it.    Some things that can make you feel better are: - Increased rest - Increasing fluid with water/sugar free electrolytes - Acetaminophen and ibuprofen as needed for fever/pain - Salt water gargling, chloraseptic spray and throat lozenges - OTC guaifenesin (Mucinex) 600 mg twice daily for congestion - Saline sinus flushes or a neti pot for congestion - Humidifying the air -Tessalon Perles every 8 hours as needed for dry cough     ED Prescriptions     Medication Sig Dispense Auth. Provider   amoxicillin-clavulanate (AUGMENTIN) 875-125 MG tablet Take 1 tablet by mouth 2 (two) times daily for 7 days. 14 tablet Noemi Chapel A, NP   benzonatate (TESSALON) 100 MG capsule Take 1 capsule (100 mg total) by mouth 3 (three)  times daily as needed for cough. Do not take with alcohol or while driving or operating heavy machinery.  May cause drowsiness. 21 capsule Eulogio Bear, NP      PDMP not reviewed this encounter.   Eulogio Bear, NP 09/27/22 [phone]

## 2022-09-27 NOTE — ED Triage Notes (Signed)
Sneezing, sinus pressure, headache, sore throat that has been going on for 10 days now. Taking nyquil and tylenol to help.
